# Patient Record
Sex: Male | Born: 1980 | Hispanic: No | Marital: Single | Smoking: Current every day smoker
Health system: Southern US, Community
[De-identification: ages and names within clinical notes are randomized; demographics above are authoritative.]

---

## 2001-11-20 ENCOUNTER — Emergency Department (HOSPITAL_COMMUNITY): Admission: EM | Admit: 2001-11-20 | Discharge: 2001-11-20 | Payer: Self-pay | Admitting: Emergency Medicine

## 2001-11-20 ENCOUNTER — Encounter: Payer: Self-pay | Admitting: Emergency Medicine

## 2002-07-20 ENCOUNTER — Emergency Department (HOSPITAL_COMMUNITY): Admission: EM | Admit: 2002-07-20 | Discharge: 2002-07-21 | Payer: Self-pay | Admitting: Emergency Medicine

## 2005-12-01 ENCOUNTER — Emergency Department (HOSPITAL_COMMUNITY): Admission: EM | Admit: 2005-12-01 | Discharge: 2005-12-01 | Payer: Self-pay | Admitting: Emergency Medicine

## 2014-08-25 ENCOUNTER — Emergency Department (HOSPITAL_COMMUNITY)
Admission: EM | Admit: 2014-08-25 | Discharge: 2014-08-26 | Disposition: A | Discharge status: Home / Self Care | Payer: Self-pay | Attending: Emergency Medicine | Admitting: Emergency Medicine

## 2014-08-25 ENCOUNTER — Encounter (HOSPITAL_COMMUNITY): Payer: Self-pay | Admitting: *Deleted

## 2014-08-25 ENCOUNTER — Emergency Department (HOSPITAL_COMMUNITY): Payer: Self-pay

## 2014-08-25 DIAGNOSIS — M549 Dorsalgia, unspecified: Secondary | ICD-10-CM

## 2014-08-25 DIAGNOSIS — M5432 Sciatica, left side: Secondary | ICD-10-CM | POA: Insufficient documentation

## 2014-08-25 MED ORDER — HYDROCODONE-ACETAMINOPHEN 5-325 MG PO TABS: 1.0000 | ORAL_TABLET | Freq: Four times a day (QID) | ORAL | Status: AC | PRN

## 2014-08-25 MED ORDER — PREDNISONE 20 MG PO TABS: ORAL_TABLET | ORAL | Status: AC

## 2014-08-25 MED ORDER — HYDROCODONE-ACETAMINOPHEN 5-325 MG PO TABS
1.0000 | ORAL_TABLET | Freq: Once | ORAL | Status: AC
  Administered 2014-08-25: 1 via ORAL
  Filled 2014-08-25: qty 1

## 2014-08-25 NOTE — ED Provider Notes (Signed)
CSN: [id number]     Arrival date & time 08/25/14  1927 History  This chart was scribed for non-physician practitioner, Raymon MuttonMarissa Nakeitha Milligan, PA-C working with [name], MD by Greggory StallionKayla Andersen, ED scribe. This patient was seen in room TR08C/TR08C and the patient's care was started at 10:07 PM.    Chief Complaint  Patient presents with  . Back Pain   The history is provided by the patient. A language interpreter was used ([place]).    HPI Comments: Andre Nixon is a 34 y.o. male with no significant past medical history who presents to the Emergency Department complaining of sudden onset lower back pain that started around 10:30 AM today. Pain does not radiate. Pt was digging in the yard and twisted the wrong way causing pain. There was no direct trauma to his back. He also reports intermittent tinging in his left leg. Pt has taken 2 tylenol tablets with no relief. Certain movements worsen pain. Pt denies fever, chills, abdominal pain, nausea, emesis, bowel or bladder incontinence, numbness in leg, loss of sensation. He denies prior injury or history of back pain. Pt denies history of IV drug use. Pt does not have a PCP but goes to a health clinic when he has any problems. He denies history of heart conditions or taking any daily medications. Pt has no allergies to medications.   No past medical history on file. No past surgical history on file. No family history on file. History  Substance Use Topics  . Smoking status: Never Smoker   . Smokeless tobacco: Not on file  . Alcohol Use: Yes     Comment: occasional    Review of Systems  Constitutional: Negative for fever and chills.  Gastrointestinal: Negative for nausea, vomiting and abdominal pain.  Genitourinary:       Negative for bowel or bladder incontinence.  Musculoskeletal: Positive for back pain.  Neurological: Negative for numbness.   Allergies  Review of patient's allergies indicates no known allergies.  Home  Medications   Prior to Admission medications   Medication Sig Start Date End Date Taking? Authorizing Provider  HYDROcodone-acetaminophen (NORCO/VICODIN) 5-325 MG per tablet Take 1 tablet by mouth every 6 (six) hours as needed. 08/25/14   [name], PA-C  predniSONE (DELTASONE) 20 MG tablet 3 tabs po day one, then 2 tabs daily x 4 days 08/25/14   [name], PA-C   BP 136/74 mmHg  Pulse 73  Temp(Src) 98.3 F (36.8 C) (Oral)  Resp 14  Ht 5\' 1"  (1.549 m)  Wt 181 lb (82.101 kg)  BMI 34.22 kg/m2  SpO2 98%   Physical Exam  Constitutional: He is oriented to person, place, and time. He appears well-developed and well-nourished. No distress.  HENT:  Head: Normocephalic and atraumatic.  Eyes: Conjunctivae and EOM are normal.  Neck: Normal range of motion. Neck supple.  Cardiovascular: Normal rate, regular rhythm and normal heart sounds.  Exam reveals no gallop and no friction rub.   No murmur heard. Pulses:      Radial pulses are 2+ on the right side, and 2+ on the left side.       Dorsalis pedis pulses are 2+ on the right side, and 2+ on the left side.  Pulmonary/Chest: Effort normal and breath sounds normal. No respiratory distress. He has no wheezes. He has no rhonchi. He has no rales. He exhibits no tenderness.  Musculoskeletal: Normal range of motion. He exhibits tenderness.       Lumbar back: He exhibits  tenderness. He exhibits normal range of motion, no bony tenderness, no swelling, no edema, no deformity and no laceration.       Back:  Negative deformities identified to the spine  Patient reports a pulling sensation with rotation of the torso  Neurological: He is alert and oriented to person, place, and time. No cranial nerve deficit. He exhibits normal muscle tone. Coordination normal. GCS eye subscore is 4. GCS verbal subscore is 5. GCS motor subscore is 6.  Cranial nerves grossly intact Strength 5+/5+ to upper and lower extremities bilaterally with resistance  applied Negative saddle paresthesias bilaterally Sensation intact with differentiation to sharp and dull touch Gait proper with-negative step-offs or sway  Skin: Skin is warm and dry. No rash noted. No erythema.  Psychiatric: He has a normal mood and affect. His behavior is normal.  Nursing note and vitals reviewed.   ED Course  Procedures (including critical care time)  DIAGNOSTIC STUDIES: Oxygen Saturation is 98% on RA, normal by my interpretation.    COORDINATION OF CARE: 10:21 PM-Discussed treatment plan which includes lumbar xray with pt at bedside and pt agreed to plan.   Dg Lumbar Spine Complete  08/25/2014   CLINICAL DATA:  Sudden onset lower back pain while working in yard, nonradiating. Initial encounter.  EXAM: LUMBAR SPINE - COMPLETE 4+ VIEW  COMPARISON:  None.  FINDINGS: There is no evidence of lumbar spine fracture. Alignment is normal. Intervertebral disc spaces are maintained.  IMPRESSION: Negative.   Electronically Signed   By: Marnee Spring M.D.   On: 08/25/2014 23:39    Labs Review Labs Reviewed - No data to display  Imaging Review Dg Lumbar Spine Complete  08/25/2014   CLINICAL DATA:  Sudden onset lower back pain while working in yard, nonradiating. Initial encounter.  EXAM: LUMBAR SPINE - COMPLETE 4+ VIEW  COMPARISON:  None.  FINDINGS: There is no evidence of lumbar spine fracture. Alignment is normal. Intervertebral disc spaces are maintained.  IMPRESSION: Negative.   Electronically Signed   By: Marnee Spring M.D.   On: 08/25/2014 23:39     EKG Interpretation None      MDM   Final diagnoses:  Back pain  Sciatica, left    Medications  HYDROcodone-acetaminophen (NORCO/VICODIN) 5-325 MG per tablet 1 tablet (1 tablet Oral Given 08/25/14 2233)    Filed Vitals:   08/25/14 2006 08/25/14 2008  BP: 136/74   Pulse: 73   Temp: 98.3 F (36.8 C)   TempSrc: Oral   Resp: 14   Height:  (1.549 m)   Weight:  181 lb (82.101 kg)  SpO2: 98%    I  personally performed the services described in this documentation, which was scribed in my presence. The recorded information has been reviewed and is accurate.  Plain film of lumbar spine negative for acute osseous injury. Negative focal neurological deficits noted. Pulses palpable and strong. Strength intact with equal distribution. Sensation intact. Gait proper with-negative step-offs or sway. Pulling sensation, as per patient, with rotation of the torso and flexion of the back. Doubt epidural abscess. Doubt cauda equina. Suspicion to be muscular pain secondary to pain upon palpation and pain with motion, cannot rule out possible sciatica with discomfort running down the posterior aspect of the left leg. Patient stable, afebrile. Patient not septic appearing. Discharged patient. Discharge patient with small dose of pain medications-discussed course, precautions, disposal technique. [facility] and orthopedics. Discussed with patient to apply icy hot ointment and  massage. Discussed with patient to avoid any heavy lifting for the next couple of days. Discussed with patient to closely monitor symptoms and if symptoms are to worsen or change to report back to the ED - strict return instructions given.  Patient agreed to plan of care, understood, all questions answered.   [name], PA-C 08/25/14 2347  Gerhard Munch, MD 08/26/14 [phone]

## 2014-08-25 NOTE — Discharge Instructions (Signed)
Please call your doctor for a followup appointment within 24-48 hours. When you talk to your doctor please let them know that you were seen in the emergency department and have them acquire all of your records so that they can discuss the findings with you and formulate a treatment plan to fully care for your new and ongoing problems. Please follow-up with health and wellness Center Please follow up with orthopedics Please rest and stay hydrated Please massage with icy hot ointment Please avoid any heavy or strenuous activity Please take medications as prescribed - while on pain medications there is to be no drinking alcohol, driving, operating any heavy machinery. If extra please dispose in a proper manner. Please do not take any extra Tylenol with this medication for this can lead to Tylenol overdose and liver issues.  Please continue to monitor symptoms closely and if symptoms are to worsen or change (fever greater than 101, chills, sweating, nausea, vomiting, chest pain, shortness of breathe, difficulty breathing, weakness, numbness, tingling, worsening or changes to pain pattern, fall, injury, loss of sensation to the leg, inability to control urine or bowel movements, direct trauma to the back) please report back to the Emergency Department immediately.   Por favor, llame a su mdico para una cita de seguimiento dentro de las 24-48 horas. Cuando hable con su mdico por favor, hgales saber que usted se observaron en el servicio de urgencias y Radio producer que se adquieren todos sus registros para que puedan discutir los [place] con usted y formular un plan de tratamiento para cuidar plenamente de sus problemas nuevos y en curso. Por favor, el seguimiento con centro de salud y [place] Por favor, siga con la ortopedia Por favor, descansar y United Technologies Corporation hidratado Por favor, masajear con ungento caliente helada Por favor, evite cualquier actividad pesada o extenuante Por favor, tome los medicamentos  prescritos - mientras que en medicamentos para el dolor va a haber ningn consumo de alcohol, conducir, al operar maquinaria pesada. Si adicional por favor disponer de Consolidated Edison. Por favor, no tome ninguna Tylenol extra con este medicamento para esto puede conducir a problemas de sobredosis de Tylenol y English as a second language teacher. Por favor, seguir de cerca los sntomas de cerca y si los sntomas son a Theme park manager o cambio (fiebre de ms de 101, escalofros, sudoracin, nuseas, vmitos, dolor en el pecho, dificultad para respirar, dificultad para respirar, debilidad, entumecimiento, hormigueo, empeoramiento o cambios en el patrn del dolor , cada, lesiones, prdida de la sensibilidad en la pierna, incapacidad para controlar la orina o el intestino, traumatismo directo en la parte posterior), por favor informe al departamento de emergencias.    Dolor de espalda, adultos  (Back Pain, Adult)  El dolor de espalda es frecuente. Con frecuencia mejora luego de algn tiempo. La causa suele no ser un peligro para la vida. La mayora de las personas aprende a controlarlo por sus propios medios.  CUIDADOS EN EL HOGAR   Mantngase fsicamente activo. Si puede, comience a dar cortas caminatas en un suelo plano. Trate de caminar un poco ms cada da.  Nopermanezca sentado, de pie ni conduzca automviles durante ms de 30 minutos seguidos. Nose quede en la cama.  Noevite los ejercicios ni el trabajo. La actividad puede ayudar a que la espalda se cure ms rpido.  Tenga cuidado al inclinarse o al levantar un objeto. Doble las rodillas, mantenga el Monument cerca de su cuerpo y no gire.  Duerma sobre un NVR Inc. Acustese sobre un lado y doble las rodillas.  Si se Citigroup, coloque una almohada debajo de las rodillas.  Tome la medicacin slo como le haya indicado el mdico.  Aplique hielo sobre la zona lesionada.  Ponga el hielo en una bolsa plstica.  Colquese una toalla entre la piel y la bolsa  de hielo.  Deje la bolsa de hielo durante 15 a 3 a 4 veces por da, durante los primeros 2 o 3 das. Luego puede ir alternando entre hielo y compresas calientes.  Consulte a su mdico sobre cul ejercicios o IT sales professional.  Evite sentirse ansioso o estresado. Encuentre la forma de enfrentar el estrs, como por Lexicographer ejercicios. SOLICITE AYUDA DE INMEDIATO SI:   El dolor no desaparece aunque haga reposo o tome medicamentos para Chief Technology Officer.  El dolor no desaparece en una semana.  Tiene nuevos problemas.  No se siente mejor.  El dolor se extiende a las piernas.  No puede controlar la orina o la materia fecal.  Siente que los brazos estn dbiles o pierde la sensibilidad (estn adormecidos).  Tiene Programme researcher, broadcasting/film/video (nuseas) y vmitos.  Siente dolor abdominal.  Siente que se desvanece (se desmaya). ASEGRESE DE QUE:   Comprende estas instrucciones.  Controlar su enfermedad.  Solicitar ayuda de inmediato si no mejora o si empeora. Document Released: 12/27/2010 Document Revised: 09/05/2011 [facility] Patient Information 2015 [place], [place]. This information is not intended to replace advice given to you by your health care provider. Make sure you discuss any questions you have with your health care provider.   Emergency Department Resource Guide 1) Find a Doctor and Pay Out of Pocket Although you won't have to find out who is covered by your insurance plan, it is a good idea to ask around and get recommendations. You will then need to call the office and see if the doctor you have chosen will accept you as a new patient and what types of options they offer for patients who are self-pay. Some doctors offer discounts or will set up payment plans for their patients who do not have insurance, but you will need to ask so you aren't surprised when you get to your appointment.  2) Contact Your Local Health Department Not all health departments have doctors  that can see patients for sick visits, but many do, so it is worth a call to see if yours does. If you don't know where your local health department is, you can check in your phone book. The CDC also has a tool to help you locate your state's health department, and many state websites also have listings of all of their local health departments.  3) Find a Walk-in Clinic If your illness is not likely to be very severe or complicated, you may want to try a walk in clinic. These are popping up all over the country in pharmacies, drugstores, and shopping centers. They're usually staffed by nurse practitioners or physician assistants that have been trained to treat common illnesses and complaints. They're usually fairly quick and inexpensive. However, if you have serious medical issues or chronic medical problems, these are probably not your best option.  No Primary Care Doctor: - Call Health Connect at  [phone] - they can help you locate a primary care doctor that  accepts your insurance, provides certain services, etc. - Physician Referral Service- [phone]  Chronic Pain Problems: Organization         Address  Phone   Notes  [facility]  [phone]  [phone] Patients need to be referred by their primary care doctor.   Medication Assistance: Organization         Address  Phone   Notes  [facility] Medication [facility] [address] [place], [place] [address] [phone] --Must be a resident of [facility] -- Must have NO insurance coverage whatsoever (no Medicaid/ Medicare, etc.) -- The pt. MUST have a primary care doctor that directs their care regularly and follows them in the community   MedAssist  [phone]   [employer]  [phone]    Agencies that provide inexpensive medical care: Organization         Address  Phone   Notes  [facility]  [phone]   [facility]    [phone]   [facility] [address] [place], [place] [address] [phone]   [facility] [address] [place], [place] [phone]   Planned Parenthood    [phone]   [facility]    [phone]   [facility]  [address], [place] Phone:  [phone], Fax:  [phone] Hours of Operation:  9 am - 6 pm, M-F.  Also accepts Medicaid/Medicare and self-pay.  [facility]  [address], [place] Phone: [phone], Fax: [phone]. Hours of Operation:  8:30 am - 5:30 pm, M-F.  Also accepts Medicaid and self-pay.  [facility] [place] [address], [place] Phone: [phone]   [facility] [address] Natasha Bence [place], [place] [phone], Ext. [phone] Mondays & Thursdays: 7-9 AM.  First 15 patients are seen on a first come, first serve basis.    Medicaid-accepting [facility] Providers:  Organization         Address  Phone   Notes  [facility] [address], [place] [phone] Also accepts self-pay patients.  [facility] [address] Laurell Josephs [place], [place]  [phone]   [facility] [address], [place] [phone]   [facility] [address] [place] [phone]   Renaye Rakers [address], [place]   [phone] Only accepts [place] Access [place] patients after they have their name applied to their card.   Self-Pay (no insurance) in [facility]:  Organization         Address  Phone   Notes  Sickle Cell Patients, [facility] Internal Medicine [address] [place], [place] [phone]   [facility] [address] [place], [place] [phone]   [facility] [place]  [address], [place] [phone]   [facility]/Dr.  Osei-Bonsu  [address], [place] or [address], [place] [phone] Phone number for both [place] and [place] locations is the same.  Urgent Medical and [facility] [address] [place] [phone]   [facility] [address], [place] or [address] [phone]   [facility] [address] [place] [phone], phone; [phone], fax Sees patients 1st and 3rd Saturday of every month.  Must not qualify for public or private insurance (i.e. Medicaid, Medicare, [place] Health Choice, Veterans' Benefits)  Household income should be no more than 200% of the poverty level The clinic cannot treat you if you are pregnant or think you are pregnant  Sexually transmitted diseases are not treated at the clinic.    Dental Care: Organization         Address  Phone  Notes  [facility] [address], [place] [phone] Accepts children up to age 34 who are enrolled in [place]icaid or [place] Health Choice; pregnant women with a Medicaid card; and children who have applied for Medicaid or [place] Health Choice, but were declined, whose parents can pay a reduced fee at time of service.  [facility] [place]  [address], [place] [phone] Accepts children up to age 34 who are enrolled in [place]icaid or [place] Health Choice; pregnant women with a Medicaid card; and children who have applied for Medicaid or [place] Health Choice, but were declined, whose parents can pay a reduced fee at time of service.  [facility]  [address], [place] [phone]) [phone] Patients are seen by appointment only. Walk-ins are not accepted. [facility] will see patients 34 years of age and older. Monday - Tuesday (8am-5pm) Most Wednesdays (8:30-5pm) $30 per visit, cash only  [facility]  [address], [place] [facility] [phone] Patients are seen by appointment only. Walk-ins are not accepted. [facility] will see patients 34 years of age and older. One Wednesday Evening (Monthly: Volunteer Based).  $30 per visit, cash only  [employer] [facility]  [phone] for adults; Children under age 544, call Graduate Pediatric Dentistry at [phone]. Children aged 414-14, please call [phone] to request a pediatric application.  Dental services are provided in all areas of dental care including fillings, crowns and bridges, complete and partial dentures, implants, gum treatment, root canals, and extractions. Preventive care is also provided. Treatment is provided to both adults and children. Patients are selected via a lottery and there is often a waiting list.   [facility] [address], [place]  [phone] [email or web address]   [facility] [address], [place], [place] [phone], Ext. [phone] Second and Fourth Thursday of each month, opens at 6:30 AM; Clinic ends at 9 AM.  Patients are seen on a first-come first-served basis, and a limited number are seen during each clinic.   [facility]  [address], [place], [place] [phone]   Eligibility Requirements You must have lived in [place], [place], or [place] counties for at least the last three months.   You cannot be eligible for state or federal sponsored National Cityhealthcare insurance, including CIGNAVeterans Administration, [place]icaid, or Harrah's EntertainmentMedicare.   You generally cannot be eligible for healthcare insurance through your employer.    How to apply: Eligibility screenings are held every Tuesday and Wednesday afternoon from 1:00 pm until 4:00 pm. You do not need an appointment for the interview!  [facility] [address], [place], [place] [phone]   [facility]  [phone]   [facility]  Health Department  [phone]   [facility]  [phone]    Behavioral Health Resources in the Community: Intensive Outpatient Programs Organization         Address  Phone  Notes  [facility] [address], [place],  [place] [phone]   [facility] Outpatient [address], [place], [place] [phone]   [facility] [address] [place] [address], [place], [place]  [phone]   [facility] [address],  [place], [place] [phone] or [phone]   Substance Abuse Resources Organization         Address  Phone  Notes  [facility] Drug Services  [phone]   [facility]  [phone]   [facility]  [phone]   [facility]  [phone]   Residential & Outpatient Substance Abuse Program  [phone]   Psychological Services Organization         Address  Phone  Notes  [facility]  [phone]- [phone]   [facility]  [phone]- [phone]   [facility] [address], [place] [phone] or [phone]    Mobile Crisis Teams Organization         Address  Phone  Notes  [facility], Mobile Crisis Care Unit  [phone]   [facility]  [address] [place] [address] [place], [place] [phone]   Doristine LocksSharon DeEsch [address] [place] [phone]    Self-Help/Support Groups Organization         Address  Phone             Notes  [facility] [place] - variety of support groups  336- [phone] Call for more information  Narcotics Anonymous (NA), [facility] [address], [place]  2 meetings at this location   Statisticianesidential Treatment Programs Organization         Address  Phone  Notes  [facility] [address],    [place]  [phone]   [facility]  [address], [place] [phone], [place], [place] [phone]   [facility]  Facility [address], [place] [phone] Admissions: 8am-3pm M-F  [facility] [address]    [place], [place] [phone]   [facility] [address], [place], [place] [phone]   [facility] [address]  [place], [place] [phone]   [facility] - Intensive Outpatient [address], [place], [place] [phone]   [facility] ([facility] [address]  [place], [place] [phone] or [phone]   [facility] (RTS) [address] [place], [place] [phone] Accepts Medicaid  [facility] [address]  [place] [phone] Substance Abuse/Addiction Treatment   [facility] Resources Organization         Address  Phone  Notes  [facility]  [phone]) [phone]   Angie FavaJulie Brannon, PhD [address], [address] [place], [place]   [phone] or [phone](336) [phone]   [facility]   [address] [place], [place] [phone]) [phone]   [facility] [address], [place], [place] [phone] Insurance/Medicaid/sponsorship through [facility]  [facility] Families [address]                                    [place], [place] [phone] Therapy/tele-psych/case  [facility] [address].   [place], [place] [phone]    Dr. Lolly MustacheArfeen  [phone]) [phone]   [facility] [place]  [employer] [facility] 1) [address], [place] 2) [address] [facility] [place] [facility]  [address], [place] °3)  [address], [place] [phone] °[phone] ° °[phone]   °[place] Child Abuse Hotline [phone]) 342-1394 or [phone] (After Hours)    ° ° ° °

## 2014-08-25 NOTE — ED Notes (Signed)
Patient reports lower back pain since working outside, patient states that he was using a shovel and digging. No neuro deficits.

## 2019-03-20 ENCOUNTER — Other Ambulatory Visit: Payer: Self-pay

## 2019-03-20 DIAGNOSIS — Z20822 Contact with and (suspected) exposure to covid-19: Secondary | ICD-10-CM

## 2019-03-21 LAB — NOVEL CORONAVIRUS, NAA: SARS-CoV-2, NAA: NOT DETECTED

## 2019-09-25 ENCOUNTER — Ambulatory Visit: Payer: Self-pay | Attending: Internal Medicine

## 2019-09-25 DIAGNOSIS — Z23 Encounter for immunization: Secondary | ICD-10-CM

## 2019-09-25 NOTE — Progress Notes (Signed)
   Covid-19 Vaccination Clinic  Name:  KOTARO BUER    MRN: [id number] DOB: 07/18/1980  09/25/2019  [name] was observed post Covid-19 immunization for 15 minutes without incident. He was provided with Vaccine Information Sheet and instruction to access the V-Safe system.   Mr. TEJERA was instructed to call 911 with any severe reactions post vaccine: Marland Kitchen Difficulty breathing  . Swelling of face and throat  . A fast heartbeat  . A bad rash all over body  . Dizziness and weakness   Immunizations Administered    Name Date Dose VIS Date Route   Pfizer COVID-19 Vaccine 09/25/2019  9:29 AM 0.3 mL 06/07/2019 Intramuscular   Manufacturer: [employer], [employer]   Lot: [id number]   NDC: [address]-3

## 2019-10-16 ENCOUNTER — Ambulatory Visit: Payer: Self-pay

## 2019-10-16 ENCOUNTER — Ambulatory Visit: Payer: Self-pay | Attending: Internal Medicine

## 2019-10-16 DIAGNOSIS — Z23 Encounter for immunization: Secondary | ICD-10-CM

## 2019-10-16 NOTE — Progress Notes (Signed)
   Covid-19 Vaccination Clinic  Name:  Andre Nixon    MRN: [id number] DOB: [birth year]  10/16/2019  [name] was observed post Covid-19 immunization for 15 minutes without incident. He was provided with Vaccine Information Sheet and instruction to access the V-Safe system.   [name] was instructed to call 911 with any severe reactions post vaccine: Marland Kitchen Difficulty breathing  . Swelling of face and throat  . A fast heartbeat  . A bad rash all over body  . Dizziness and weakness   Immunizations Administered    Name Date Dose VIS Date Route   Pfizer COVID-19 Vaccine 10/16/2019  4:31 PM 0.3 mL 08/21/2018 Intramuscular   Manufacturer: [employer] Corporation, [employer]   Lot: [id number]   NDC: [address]-8

## 2019-11-11 ENCOUNTER — Other Ambulatory Visit: Payer: Self-pay

## 2019-11-11 ENCOUNTER — Emergency Department (HOSPITAL_COMMUNITY)
Admission: EM | Admit: 2019-11-11 | Discharge: 2019-11-11 | Disposition: A | Discharge status: Home / Self Care | Payer: Self-pay | Attending: Emergency Medicine | Admitting: Emergency Medicine

## 2019-11-11 ENCOUNTER — Emergency Department (HOSPITAL_COMMUNITY): Payer: Self-pay

## 2019-11-11 ENCOUNTER — Encounter (HOSPITAL_COMMUNITY): Payer: Self-pay

## 2019-11-11 DIAGNOSIS — Y9259 Other trade areas as the place of occurrence of the external cause: Secondary | ICD-10-CM | POA: Insufficient documentation

## 2019-11-11 DIAGNOSIS — Z23 Encounter for immunization: Secondary | ICD-10-CM | POA: Insufficient documentation

## 2019-11-11 DIAGNOSIS — W270XXA Contact with workbench tool, initial encounter: Secondary | ICD-10-CM | POA: Insufficient documentation

## 2019-11-11 DIAGNOSIS — Y939 Activity, unspecified: Secondary | ICD-10-CM | POA: Insufficient documentation

## 2019-11-11 DIAGNOSIS — Y990 Civilian activity done for income or pay: Secondary | ICD-10-CM | POA: Insufficient documentation

## 2019-11-11 DIAGNOSIS — S6992XA Unspecified injury of left wrist, hand and finger(s), initial encounter: Secondary | ICD-10-CM | POA: Insufficient documentation

## 2019-11-11 MED ORDER — CEPHALEXIN 500 MG PO CAPS: 500.0000 mg | ORAL_CAPSULE | Freq: Three times a day (TID) | ORAL | 0 refills | Status: AC

## 2019-11-11 MED ORDER — TETANUS-DIPHTH-ACELL PERTUSSIS 5-2.5-18.5 LF-MCG/0.5 IM SUSP
0.5000 mL | Freq: Once | INTRAMUSCULAR | Status: AC
  Administered 2019-11-11: 0.5 mL via INTRAMUSCULAR
  Filled 2019-11-11: qty 0.5

## 2019-11-11 MED ORDER — CEPHALEXIN 250 MG PO CAPS
500.0000 mg | ORAL_CAPSULE | Freq: Once | ORAL | Status: AC
  Administered 2019-11-11: 500 mg via ORAL
  Filled 2019-11-11: qty 2

## 2019-11-11 MED ORDER — NAPROXEN 500 MG PO TABS: 500.0000 mg | ORAL_TABLET | Freq: Two times a day (BID) | ORAL | 0 refills | Status: AC

## 2019-11-11 MED ORDER — NAPROXEN 250 MG PO TABS
500.0000 mg | ORAL_TABLET | Freq: Once | ORAL | Status: AC
  Administered 2019-11-11: 500 mg via ORAL
  Filled 2019-11-11: qty 2

## 2019-11-11 NOTE — ED Provider Notes (Signed)
[facility] EMERGENCY DEPARTMENT Provider Note   CSN: [id number] Arrival date & time: 11/11/19  0915     History Chief Complaint  Patient presents with  . Hand Injury    Andre Nixon is a 39 y.o. male without significant past medical hx who presents to the ED s/p L hand injury which occurred 2-3 hours PTA. Patient states that he was at work when he accidentally slipped and stabbed his L palm with a screw driver. States it went in fairly deep but did not puncture the other side. He states the area is painful, currently 7/10 in severity improved from prior, with no alleviating/aggravating factors. Reports associated swelling. Denies fever, chills, purulent drainage, numbness, or weakness. Patient is R hand dominant. Unknown last tetanus.   Translator line utilized throughout Audiological scientist.   HPI     History reviewed. No pertinent past medical history.  There are no problems to display for this patient.   History reviewed. No pertinent surgical history.     No family history on file.  Social History   Tobacco Use  . Smoking status: Never Smoker  Substance Use Topics  . Alcohol use: Yes    Comment: occasional  . Drug use: Not on file    Home Medications Prior to Admission medications   Medication Sig Start Date End Date Taking? Authorizing Provider  HYDROcodone-acetaminophen (NORCO/VICODIN) 5-325 MG per tablet Take 1 tablet by mouth every 6 (six) hours as needed. 08/25/14   Sciacca, Marissa, PA-C  predniSONE (DELTASONE) 20 MG tablet 3 tabs po day one, then 2 tabs daily x 4 days 08/25/14   [name], PA-C    Allergies    Patient has no known allergies.  Review of Systems   Review of Systems  Constitutional: Negative for chills and fever.  Respiratory: Negative for shortness of breath.   Cardiovascular: Negative for chest pain.  Gastrointestinal: Negative for abdominal pain.  Musculoskeletal: Positive for arthralgias and joint  swelling.  Skin: Positive for wound. Negative for color change.  Neurological: Negative for syncope.    Physical Exam Updated Vital Signs BP (!) 137/99   Pulse 66   Temp 98.5 F (36.9 C) (Oral)   Resp 18   SpO2 100%   Physical Exam Vitals and nursing note reviewed.  Constitutional:      General: He is not in acute distress.    Appearance: Normal appearance. He is not ill-appearing or toxic-appearing.  HENT:     Head: Normocephalic and atraumatic.  Neck:     Comments: No midline tenderness.  Cardiovascular:     Rate and Rhythm: Normal rate.     Pulses:          Radial pulses are 2+ on the right side and 2+ on the left side.  Pulmonary:     Effort: No respiratory distress.     Breath sounds: Normal breath sounds.  Musculoskeletal:     Cervical back: Normal range of motion and neck supple.     Comments: Upper extremities: LUE: Swelling noted to the 1st MCP/metacarpal region especially dorsally. Small 2-3 mm puncture wound to the thenar eminence, no active bleeding or appreciable FB. Patient has intact AROM throughout. Able to flex/extend IP/MCP joints against resistance. Tender to palpation over the 1st/2nd MCP and metacarpals. No anatomical snuffbox tenderness. Otherwise nontender.   Skin:    General: Skin is warm and dry.     Capillary Refill: Capillary refill takes less than 2 seconds.  Neurological:  Mental Status: He is alert.     Comments: Alert. Clear speech. Sensation grossly intact to bilateral upper extremities. 5/5 symmetric grip strength. Ambulatory.  Able to perform okay sign, thumbs up, and cross second/third digits bilaterally.  Psychiatric:        Mood and Affect: Mood normal.        Behavior: Behavior normal.         ED Results / Procedures / Treatments   Labs (all labs ordered are listed, but only abnormal results are displayed) Labs Reviewed - No data to display  EKG None  Radiology DG Hand Complete Left  Result Date: 11/11/2019 CLINICAL  DATA:  Work injury.  Puncture wound thenar eminence. EXAM: LEFT HAND - COMPLETE 3+ VIEW COMPARISON:  None. FINDINGS: There is no evidence of fracture or dislocation. There is no evidence of arthropathy or other focal bone abnormality. Soft tissues are unremarkable. No foreign body. IMPRESSION: Negative. Electronically Signed   By: Franchot Gallo M.D.   On: 11/11/2019 10:21    Procedures Procedures (including critical care time)  Medications Ordered in ED Medications  Tdap (BOOSTRIX) injection 0.5 mL (has no administration in time range)  naproxen (NAPROSYN) tablet 500 mg (has no administration in time range)  cephALEXin (KEFLEX) capsule 500 mg (has no administration in time range)    ED Course  I have reviewed the triage vital signs and the nursing notes.  Pertinent labs & imaging results that were available during my care of the patient were reviewed by me and considered in my medical decision making (see chart for details).    MDM Rules/Calculators/A&P                     Patient presents to the ED for evaluation of left hand injury. He is nontoxic, resting comfortably, vitals WNL with the exception of elevated diastolic BP- doubt HTN emergency. Patient has small 2-3 mm wound to the thenar eminence of the left palm, pressure irrigated, visualized in bloodless field- no evidence of FB, does not appear to require closure with sutures/staples/skin adhesive.  Soft tissue swelling noted.  No signs of infection at this time.  X-ray obtained, I personally reviewed and interpreted, no evidence of fracture or radiopaque foreign bodies.  Ace wrap applied. Tetanus updated. Will discharge home with naproxen & keflex given patient states this was not an entirely clean screw driver but also was not entirely dirty. I discussed results, treatment plan, need for follow-up, and return precautions with the patient. Provided opportunity for questions, patient confirmed understanding and is in agreement with plan.  Findings and plan of care discussed with supervising physician Dr. Ronnald Nian who is in agreement.    Final Clinical Impression(s) / ED Diagnoses Final diagnoses:  Injury of left hand, initial encounter    Rx / DC Orders ED Discharge Orders         Ordered    cephALEXin (KEFLEX) 500 MG capsule  3 times daily     11/11/19 1102    naproxen (NAPROSYN) 500 MG tablet  2 times daily     11/11/19 [address], [place] R, PA-C 11/11/19 1104    Lennice Sites, DO 11/11/19 1159

## 2019-11-11 NOTE — ED Notes (Signed)
Hand wound irrigated with sterile water cleaned and ace wrap applied.

## 2019-11-11 NOTE — ED Triage Notes (Signed)
Spanish interpreter used for triage: Pt accidentally stabbed his left hand with a screwdriver while at work today, no bleeding but significant swelling noted. Last tetanus unknown.

## 2019-11-11 NOTE — Discharge Instructions (Addendum)
Please read and follow all provided instructions.  You have been seen today for a left hand injury.   Tests performed today include: An x-ray of the affected area - does NOT show any broken bones or dislocations.  Vital signs. See below for your results today.   Home care instructions: -- *PRICE in the first 24-48 hours after injury: Protect with Ace wrap Rest Ice- Do not apply ice pack directly to your skin, place towel or similar between your skin and ice/ice pack. Apply ice for 20 min, then remove for 40 min while awake Compression- Wear brace, elastic bandage, splint as directed by your provider Elevate affected extremity above the level of your heart when not walking around for the first 24-48 hours   Medications:  - Naproxen is a nonsteroidal anti-inflammatory medication that will help with pain and swelling. Be sure to take this medication as prescribed with food, 1 pill every 12 hours,  It should be taken with food, as it can cause stomach upset, and more seriously, stomach bleeding. Do not take other nonsteroidal anti-inflammatory medications with this such as Advil, Motrin, Aleve, Mobic, Goodie Powder, or Motrin.    - Keflex- This is an antibiotic to help prevent infection in the wound.   You make take Tylenol per over the counter dosing with these medications.   We have prescribed you new medication(s) today. Discuss the medications prescribed today with your pharmacist as they can have adverse effects and interactions with your other medicines including over the counter and prescribed medications. Seek medical evaluation if you start to experience new or abnormal symptoms after taking one of these medicines, seek care immediately if you start to experience difficulty breathing, feeling of your throat closing, facial swelling, or rash as these could be indications of a more serious allergic reaction   Follow-up instructions: Please follow-up with your primary care provider or  with the hand surgeon provided in your discharge instructions within 3 to 5 days.  Return instructions:  Please return if your digits or extremity are numb or tingling, appear gray or blue, or you have severe pain (also elevate the extremity and loosen splint or wrap if you were given one) Please return if you have redness or fevers.  Please return to the Emergency Department if you experience worsening symptoms.  Please return if you have any other emergent concerns. Additional Information:  Your vital signs today were: BP (!) 137/99   Pulse 66   Temp 98.5 F (36.9 C) (Oral)   Resp 18   SpO2 100%  If your blood pressure (BP) was elevated above 135/85 this visit, please have this repeated by your doctor within one month. ---------------  Google translate: Lea y siga todas las instrucciones proporcionadas.  Lo han visto hoy por una lesin en la mano izquierda.  Las pruebas realizadas hoy incluyen: 1. Ardelia Mems radiografa del rea afectada: NO muestra huesos rotos o dislocaciones. 2. Signos vitales. Vea a continuacin sus [employer].  Instrucciones de cuidado en el hogar: - * PRECIO en las primeras 24-48 horas despus de la lesin: Proteger con Scientist, physiological Hielo: no aplique la compresa de hielo directamente sobre la piel, coloque una toalla o algo similar entre la piel y la compresa de hielo / hielo. Aplique hielo durante 20 minutos, luego retrelo durante 40 minutos mientras est despierto Compresin: use un aparato ortopdico, una venda elstica y Ardelia Mems frula segn las indicaciones de su proveedor. Eleve la extremidad afectada por encima del nivel de  su corazn cuando no camine durante las primeras 24-48 horas  Medicamentos: - El naproxeno es un medicamento antiinflamatorio no esteroideo que ayudar con Conservation officer, historic buildings y la hinchazn. Asegrese de tomar [employer] segn lo prescrito con alimentos, 1 pastilla cada 12 horas. Debe tomarse con alimentos, ya que puede causar  [employer] y, lo que es ms grave, sangrado estomacal. No tome otros medicamentos antiinflamatorios no esteroides con esto, como Advil, Motrin, Aleve, Mobic, Goodie Powder o Motrin.  - Keflex: este es un antibitico que ayuda a prevenir infecciones en la herida.  Puede tomar Tylenol por dosis de venta libre con [employer].  Le hemos recetado nuevos medicamentos hoy. Discuta los medicamentos recetados hoy con su farmacutico, ya que pueden tener efectos adversos e interacciones con sus otros medicamentos, incluidos los medicamentos recetados y de [place]. Busque una evaluacin mdica si comienza a experimentar sntomas nuevos o anormales despus de tomar uno de estos medicamentos, busque atencin mdica de inmediato si comienza a experimentar dificultad para respirar, sensacin de cierre de la garganta, hinchazn facial o sarpullido, ya que estos podran ser indicios de un problema ms grave reaccin alrgica   Instrucciones de seguimiento: Haga un seguimiento con su proveedor de atencin primaria o con el cirujano de mano que se proporciona en sus instrucciones de alta en un plazo de 3 a 5 das.  Instrucciones de devolucin: 1. Regrese si sus dedos o extremidad estn entumecidos u hormiguean, se ven grises o azules, o si tiene Social research officer, government severo (tambin [employer] la extremidad y afloje la frula o envuelva si le dieron [place]) 2. Regrese si tiene enrojecimiento o fiebre. 3. Regrese al Departamento de Emergencias si experimenta un empeoramiento de los sntomas. 4. Regrese si tiene alguna otra inquietud emergente. Informacin Adicional:  Tus signos vitales hoy fueron: BP (!) 137/99  Pulse 66  Temp. 98.5  F (36.9  C) (Oral)  Resp 18  SpO2 100% Si su presin arterial (PA) se elev por encima de 135/85 en esta visita, pdale a su mdico que lo repita en el plazo de un mes. ---------------

## 2021-03-16 IMAGING — CR DG HAND COMPLETE 3+V*L*
3 series · 3 of 3 positions shown · non-contrast
Comparison: None.

CLINICAL DATA: Work injury.  Puncture wound thenar eminence.

EXAM:
LEFT HAND - COMPLETE 3+ VIEW

[hand pa]
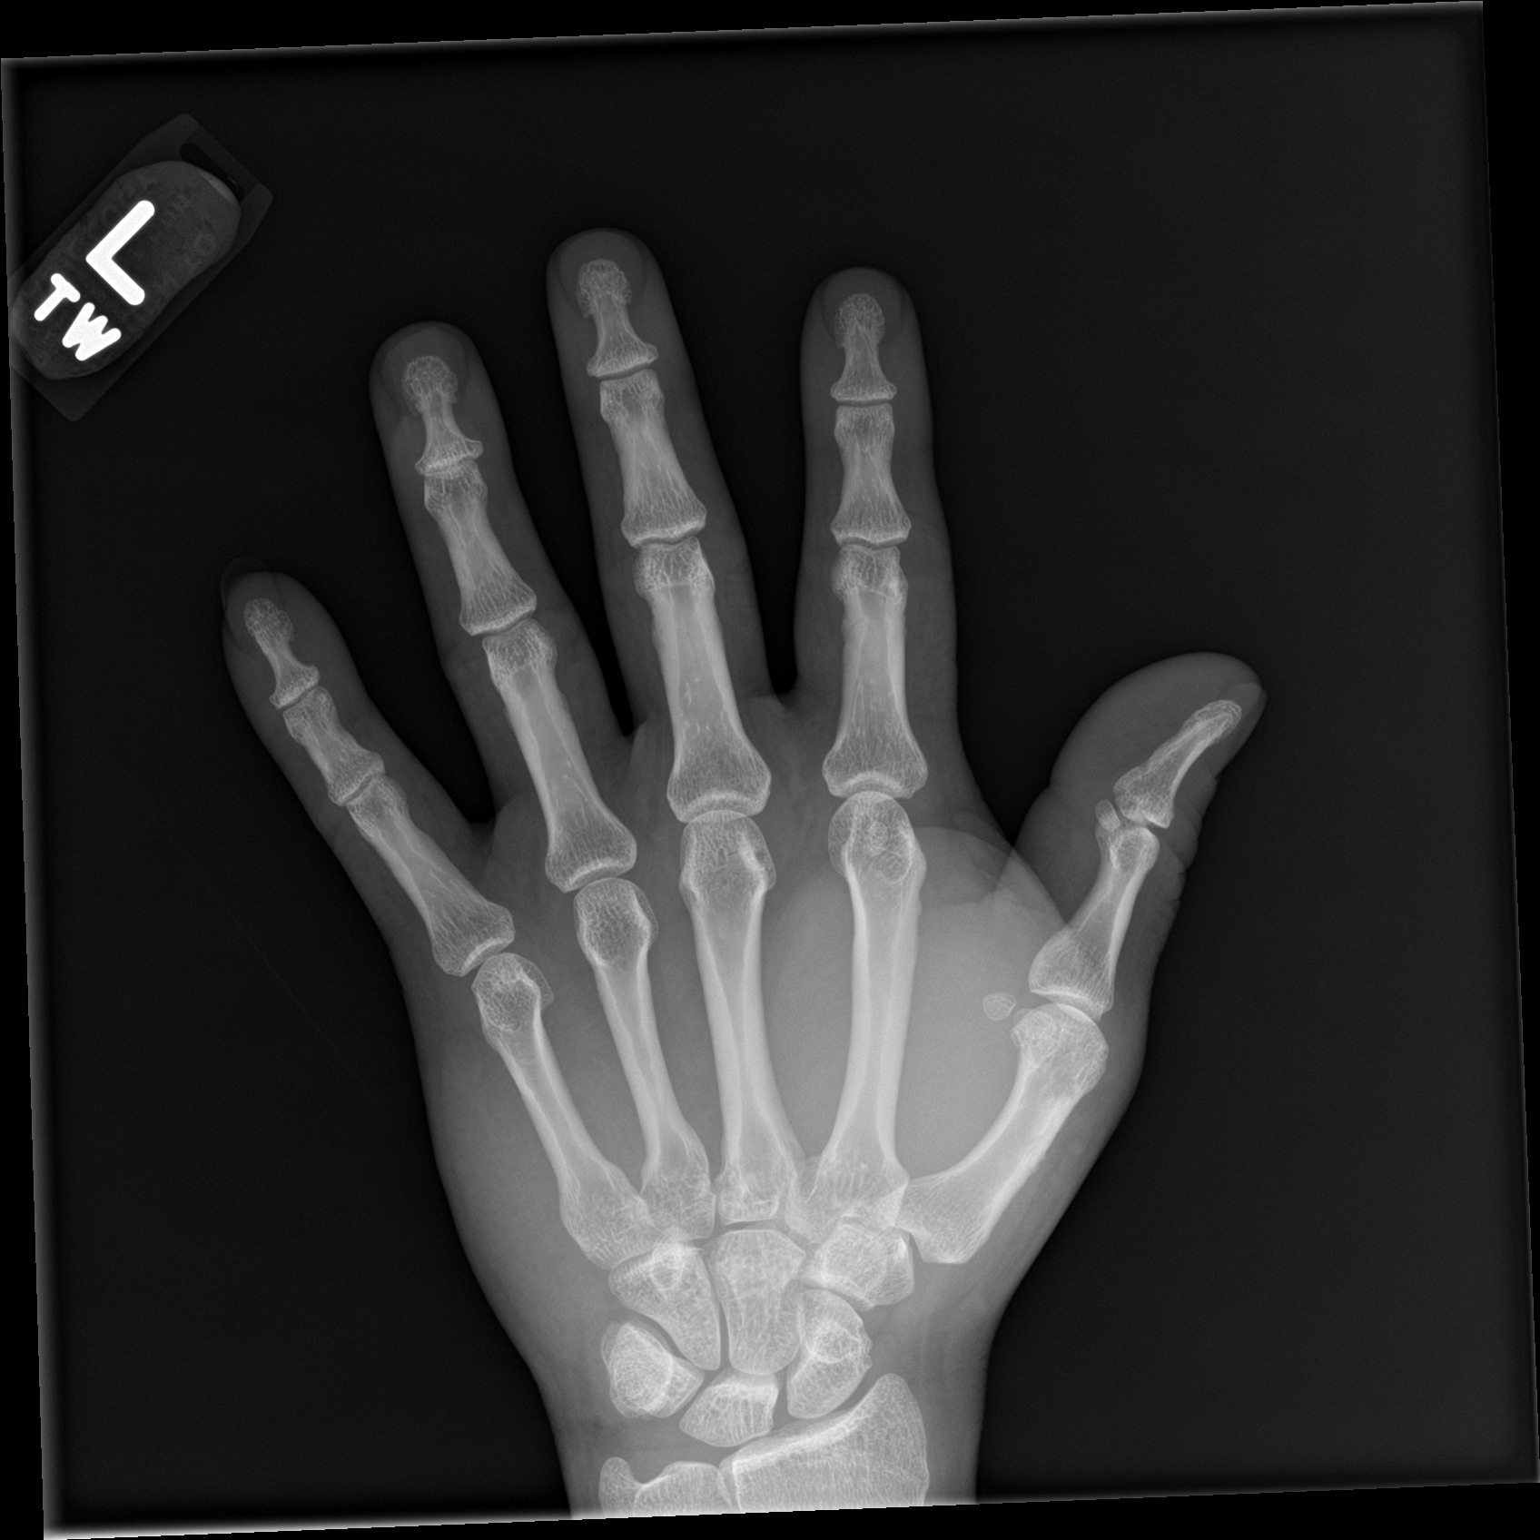

[hand obl]
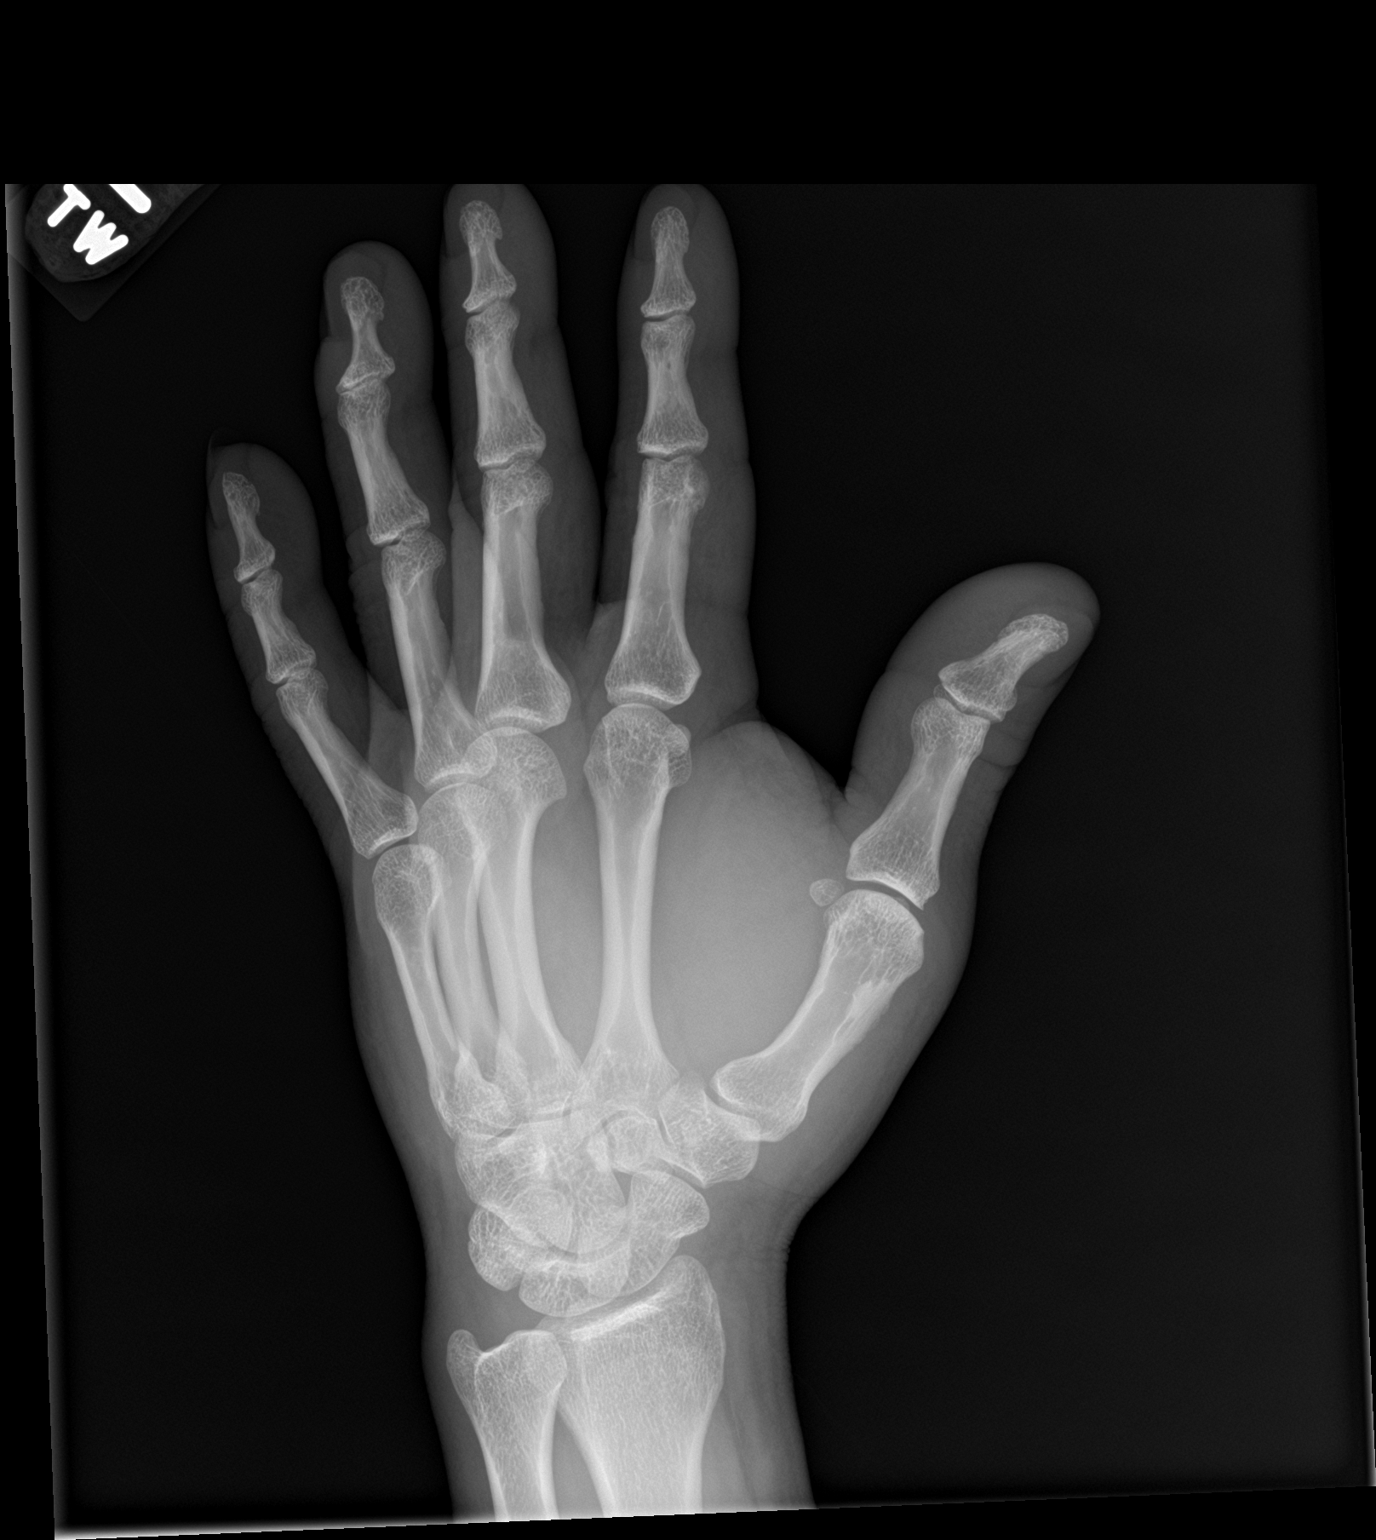

[hand lat]
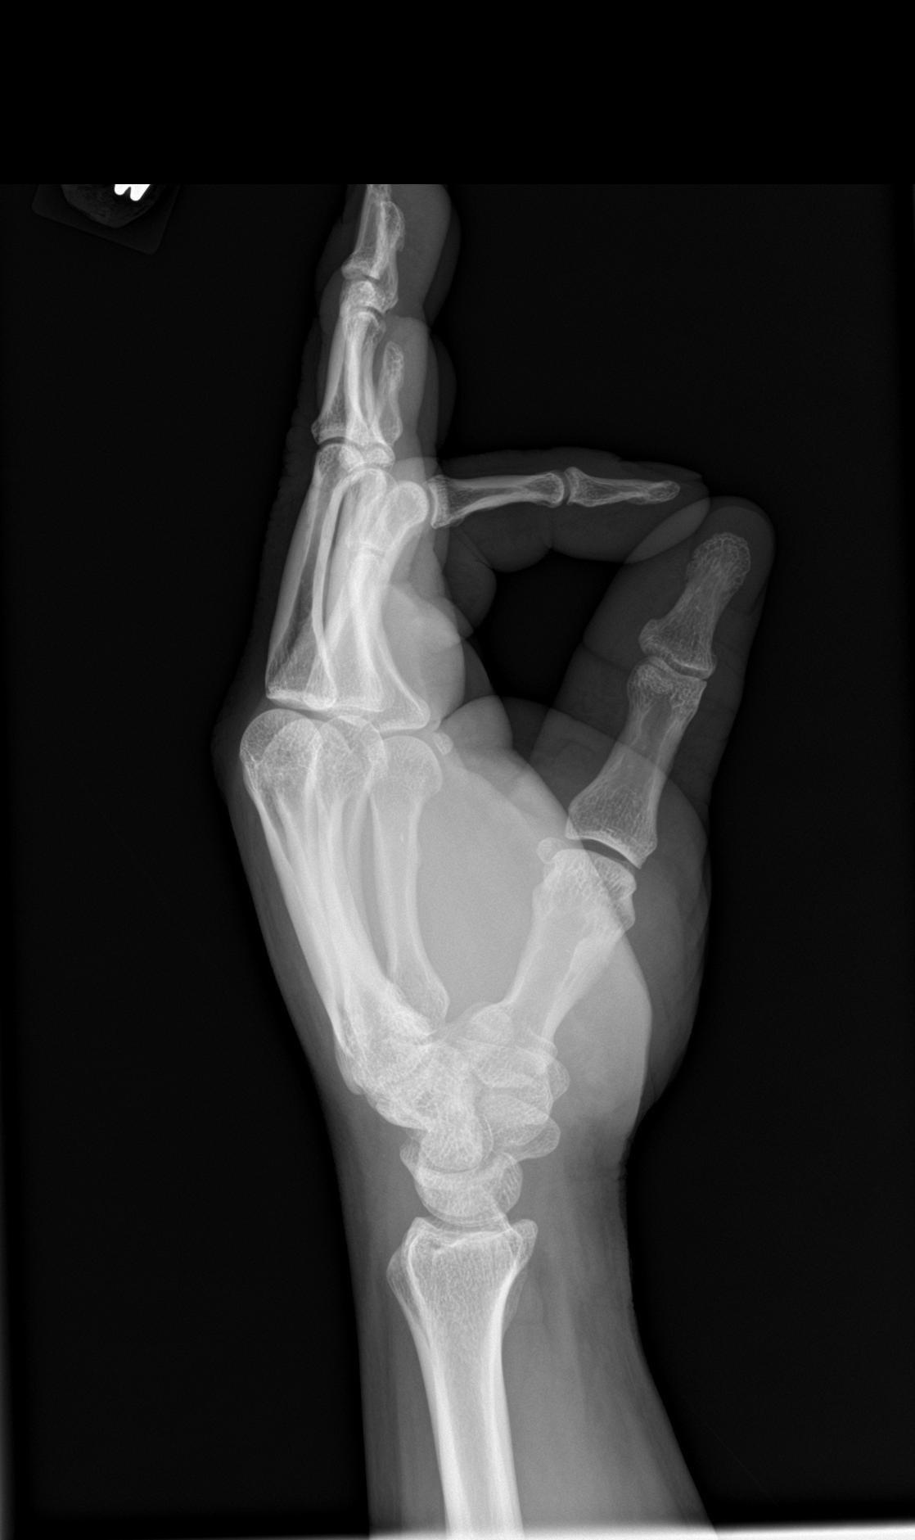

[3 of 3 positions shown; findings below may reference images not displayed]

FINDINGS: There is no evidence of fracture or dislocation. There is no
evidence of arthropathy or other focal bone abnormality. Soft
tissues are unremarkable. No foreign body.
IMPRESSION: Negative.

## 2023-11-09 ENCOUNTER — Ambulatory Visit
Admission: EM | Admit: 2023-11-09 | Discharge: 2023-11-09 | Disposition: A | Discharge status: Home / Self Care | Payer: Self-pay

## 2023-11-09 DIAGNOSIS — W460XXA Contact with hypodermic needle, initial encounter: Secondary | ICD-10-CM

## 2023-11-09 DIAGNOSIS — S61439A Puncture wound without foreign body of unspecified hand, initial encounter: Secondary | ICD-10-CM

## 2023-11-09 MED ORDER — DOXYCYCLINE HYCLATE 100 MG PO CAPS: 100.0000 mg | ORAL_CAPSULE | Freq: Two times a day (BID) | ORAL | 0 refills | Status: AC

## 2023-11-09 NOTE — ED Triage Notes (Signed)
 Last Tdap: 11-11-2019

## 2023-11-09 NOTE — ED Provider Notes (Signed)
 [facility]  Note:  This document was prepared using Conservation officer, historic buildings and may include unintentional dictation errors.  MRN: [id number] DOB: Nov 07, 1980  Subjective:   Andre Nixon is a 43 y.o. male presenting for evaluation after an accidental needlestick while working.  Patient reports that he was installing a washing machine into residence when his hand was on the floor and evidently there was a loose insulin pen needle on the floor which jabbed his finger.  Patient does not know who the needle belongs to but would like testing to rule out any blood-borne pathogen.  No current facility-administered medications for this encounter.  Current Outpatient Medications:    doxycycline (VIBRAMYCIN) 100 MG capsule, Take 1 capsule (100 mg total) by mouth 2 (two) times daily for 5 days., Disp: 10 capsule, Rfl: 0   fexofenadine (ALLEGRA) 180 MG tablet, Take 180 mg by mouth daily., Disp: , Rfl:    ibuprofen (ADVIL) 200 MG tablet, Take 200 mg by mouth every 6 (six) hours as needed., Disp: , Rfl:    loratadine (CLARITIN) 10 MG tablet, Take 10 mg by mouth daily., Disp: , Rfl:    cephALEXin  (KEFLEX ) 500 MG capsule, Take 1 capsule (500 mg total) by mouth 3 (three) times daily., Disp: 21 capsule, Rfl: 0   HYDROcodone -acetaminophen  (NORCO/VICODIN) 5-325 MG per tablet, Take 1 tablet by mouth every 6 (six) hours as needed., Disp: 5 tablet, Rfl: 0   naproxen  (NAPROSYN ) 500 MG tablet, Take 1 tablet (500 mg total) by mouth 2 (two) times daily., Disp: 10 tablet, Rfl: 0   predniSONE  (DELTASONE ) 20 MG tablet, 3 tabs po day one, then 2 tabs daily x 4 days, Disp: 11 tablet, Rfl: 0   No Known Allergies  History reviewed. No pertinent past medical history.   History reviewed. No pertinent surgical history.  History reviewed. No pertinent family history.  Social History   Tobacco Use   Smoking status: Every Day    Types: Cigarettes   Smokeless tobacco: Never  Vaping Use    Vaping status: Never Used  Substance Use Topics   Alcohol use: Yes    Comment: occasional   Drug use: Not Currently    ROS Refer to HPI for ROS details.  Objective:   Vitals: BP 125/81 (BP Location: Left Arm)   Pulse 76   Temp 98.3 F (36.8 C) (Oral)   Resp 18   Ht 5\' 1"  (1.549 m)   Wt 181 lb (82.1 kg)   SpO2 97%   BMI 34.20 kg/m   Physical Exam Vitals and nursing note reviewed.  Constitutional:      General: He is not in acute distress.    Appearance: He is well-developed. He is not ill-appearing or toxic-appearing.  HENT:     Head: Normocephalic.  Cardiovascular:     Rate and Rhythm: Normal rate.  Pulmonary:     Effort: Pulmonary effort is normal. No respiratory distress.  Skin:    General: Skin is warm and dry.  Neurological:     General: No focal deficit present.     Mental Status: He is alert and oriented to person, place, and time.  Psychiatric:        Mood and Affect: Mood normal.        Behavior: Behavior normal.     Procedures  No results found for this or any previous visit (from the past 24 hours).  No results found.   Assessment and Plan :  Discharge Instructions       1. Needle stick, hypodermic, accidental, initial encounter (Primary) - Comprehensive metabolic panel - CBC with Differential/Platelet - HIV Antibody (routine testing w rflx) - Hepatitis panel, acute - Screening serum test collected in UC and sent to lab for further testing results should be available in 1 to 2 days. - Please return to urgent care for reevaluation in 3 months for repeat laboratory testing to ensure that there is no change to present lab values. -Prescription sent to pharmacy for doxycycline twice daily for 5 days for infection prevention secondary to needlestick. -Continue to monitor symptoms for any change in severity if there is any escalation of current symptoms or development of new symptoms follow-up in ER for further evaluation and management.      Chianna Spirito B Hassell Patras   Denyse Fillion, Mes[place] B, NP 11/09/23 2002

## 2023-11-09 NOTE — Discharge Instructions (Addendum)
  1. Needle stick, hypodermic, accidental, initial encounter (Primary) - Comprehensive metabolic panel - CBC with Differential/Platelet - HIV Antibody (routine testing w rflx) - Hepatitis panel, acute - Screening serum test collected in UC and sent to lab for further testing results should be available in 1 to 2 days. - Please return to urgent care for reevaluation in 3 months for repeat laboratory testing to ensure that there is no change to present lab values. -Prescription sent to pharmacy for doxycycline twice daily for 5 days for infection prevention secondary to needlestick. -Continue to monitor symptoms for any change in severity if there is any escalation of current symptoms or development of new symptoms follow-up in ER for further evaluation and management.

## 2023-11-09 NOTE — ED Triage Notes (Signed)
 Due to language barrier, an interpreter was present during the history-taking and subsequent discussion (and for part of the physical exam) with this patient. Andre Nixon. Number: [id number].  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 No fever. "I work for my brother". Note: the cap/device/item that caused the prick is with patient for inspection.

## 2023-11-15 LAB — CBC WITH DIFFERENTIAL/PLATELET
Basophils Absolute: 0 10*3/uL (ref 0.0–0.2)
Basos: 1 %
EOS (ABSOLUTE): 0.2 10*3/uL (ref 0.0–0.4)
Eos: 4 %
Hematocrit: 44.4 % (ref 37.5–51.0)
Hemoglobin: 14.7 g/dL (ref 13.0–17.7)
Immature Grans (Abs): 0 10*3/uL (ref 0.0–0.1)
Immature Granulocytes: 0 %
Lymphocytes Absolute: 1.7 10*3/uL (ref 0.7–3.1)
Lymphs: 30 %
MCH: 29.4 pg (ref 26.6–33.0)
MCHC: 33.1 g/dL (ref 31.5–35.7)
MCV: 89 fL (ref 79–97)
Monocytes Absolute: 0.5 10*3/uL (ref 0.1–0.9)
Monocytes: 9 %
Neutrophils Absolute: 3.2 10*3/uL (ref 1.4–7.0)
Neutrophils: 56 %
Platelets: 313 10*3/uL (ref 150–450)
RBC: 5 x10E6/uL (ref 4.14–5.80)
RDW: 14 % (ref 11.6–15.4)
WBC: 5.6 10*3/uL (ref 3.4–10.8)

## 2023-11-15 LAB — COMPREHENSIVE METABOLIC PANEL WITH GFR
ALT: 31 IU/L (ref 0–44)
AST: 26 IU/L (ref 0–40)
Albumin: 4.7 g/dL (ref 4.1–5.1)
Alkaline Phosphatase: 90 IU/L (ref 44–121)
BUN/Creatinine Ratio: 19 (ref 9–20)
BUN: 15 mg/dL (ref 6–24)
Bilirubin Total: 0.3 mg/dL (ref 0.0–1.2)
CO2: 23 mmol/L (ref 20–29)
Calcium: 9.6 mg/dL (ref 8.7–10.2)
Chloride: 105 mmol/L (ref 96–106)
Creatinine, Ser: 0.81 mg/dL (ref 0.76–1.27)
Globulin, Total: 2.6 g/dL (ref 1.5–4.5)
Glucose: 125 mg/dL — ABNORMAL HIGH (ref 70–99)
Potassium: 4.2 mmol/L (ref 3.5–5.2)
Sodium: 143 mmol/L (ref 134–144)
Total Protein: 7.3 g/dL (ref 6.0–8.5)
eGFR: 113 mL/min/{1.73_m2} (ref >59)

## 2023-11-15 LAB — HIV ANTIBODY (ROUTINE TESTING W REFLEX): HIV Screen 4th Generation wRfx: NONREACTIVE

## 2023-11-16 ENCOUNTER — Ambulatory Visit: Payer: Self-pay

## 2023-11-17 LAB — ACUTE VIRAL HEPATITIS (HAV, HBV, HCV)
HCV Ab: NONREACTIVE
Hep A IgM: NEGATIVE
Hep B C IgM: NEGATIVE
Hepatitis B Surface Ag: NEGATIVE

## 2023-11-17 LAB — SPECIMEN STATUS REPORT

## 2023-11-17 LAB — HCV INTERPRETATION
